# Patient Record
Sex: Female | Born: 1990 | Race: Black or African American | Hispanic: No | Marital: Single | Smoking: Current some day smoker
Health system: Southern US, Community
[De-identification: ages and names within clinical notes are randomized; demographics above are authoritative.]

## PROBLEM LIST (undated history)

## (undated) DIAGNOSIS — L0291 Cutaneous abscess, unspecified: Secondary | ICD-10-CM

---

## 2014-02-23 ENCOUNTER — Encounter (HOSPITAL_COMMUNITY): Payer: Self-pay | Admitting: Emergency Medicine

## 2014-02-23 ENCOUNTER — Emergency Department (HOSPITAL_COMMUNITY)
Admission: EM | Admit: 2014-02-23 | Discharge: 2014-02-23 | Disposition: A | Discharge status: Home / Self Care | Payer: BC Managed Care – PPO | Source: Home / Self Care | Attending: Family Medicine | Admitting: Family Medicine

## 2014-02-23 DIAGNOSIS — R109 Unspecified abdominal pain: Secondary | ICD-10-CM

## 2014-02-23 LAB — POCT URINALYSIS DIP (DEVICE)
BILIRUBIN URINE: NEGATIVE
Glucose, UA: NEGATIVE mg/dL
Ketones, ur: NEGATIVE mg/dL
Leukocytes, UA: NEGATIVE
NITRITE: NEGATIVE
Protein, ur: NEGATIVE mg/dL
Specific Gravity, Urine: >=1.03 (ref 1.005–1.030)
UROBILINOGEN UA: 0.2 mg/dL (ref 0.0–1.0)
pH: 6.5 (ref 5.0–8.0)

## 2014-02-23 LAB — POCT PREGNANCY, URINE: Preg Test, Ur: NEGATIVE

## 2014-02-23 NOTE — ED Notes (Signed)
Lower abdominal area pain , and feels as if there is something moving in her abdominal area. NAD. Reportedly has had symptoms since yesterday

## 2014-02-23 NOTE — Discharge Instructions (Signed)
Thank you for coming in today. Bring the stool sample back.  We will call you with results.  Abdominal Pain, Women Abdominal (stomach, pelvic, or belly) pain can be caused by many things. It is important to tell your doctor:  The location of the pain.  Does it come and go or is it present all the time?  Are there things that start the pain (eating certain foods, exercise)?  Are there other symptoms associated with the pain (fever, nausea, vomiting, diarrhea)? All of this is helpful to know when trying to find the cause of the pain. CAUSES   Stomach: virus or bacteria infection, or ulcer.  Intestine: appendicitis (inflamed appendix), regional ileitis (Crohn's disease), ulcerative colitis (inflamed colon), irritable bowel syndrome, diverticulitis (inflamed diverticulum of the colon), or cancer of the stomach or intestine.  Gallbladder disease or stones in the gallbladder.  Kidney disease, kidney stones, or infection.  Pancreas infection or cancer.  Fibromyalgia (pain disorder).  Diseases of the female organs:  Uterus: fibroid (non-cancerous) tumors or infection.  Fallopian tubes: infection or tubal pregnancy.  Ovary: cysts or tumors.  Pelvic adhesions (scar tissue).  Endometriosis (uterus lining tissue growing in the pelvis and on the pelvic organs).  Pelvic congestion syndrome (female organs filling up with blood just before the menstrual period).  Pain with the menstrual period.  Pain with ovulation (producing an egg).  Pain with an IUD (intrauterine device, birth control) in the uterus.  Cancer of the female organs.  Functional pain (pain not caused by a disease, may improve without treatment).  Psychological pain.  Depression. DIAGNOSIS  Your doctor will decide the seriousness of your pain by doing an examination.  Blood tests.  X-rays.  Ultrasound.  CT scan (computed tomography, special type of X-ray).  MRI (magnetic resonance  imaging).  Cultures, for infection.  Barium enema (dye inserted in the large intestine, to better view it with X-rays).  Colonoscopy (looking in intestine with a lighted tube).  Laparoscopy (minor surgery, looking in abdomen with a lighted tube).  Major abdominal exploratory surgery (looking in abdomen with a large incision). TREATMENT  The treatment will depend on the cause of the pain.   Many cases can be observed and treated at home.  Over-the-counter medicines recommended by your caregiver.  Prescription medicine.  Antibiotics, for infection.  Birth control pills, for painful periods or for ovulation pain.  Hormone treatment, for endometriosis.  Nerve blocking injections.  Physical therapy.  Antidepressants.  Counseling with a psychologist or psychiatrist.  Minor or major surgery. HOME CARE INSTRUCTIONS   Do not take laxatives, unless directed by your caregiver.  Take over-the-counter pain medicine only if ordered by your caregiver. Do not take aspirin because it can cause an upset stomach or bleeding.  Try a clear liquid diet (broth or water) as ordered by your caregiver. Slowly move to a bland diet, as tolerated, if the pain is related to the stomach or intestine.  Have a thermometer and take your temperature several times a day, and record it.  Bed rest and sleep, if it helps the pain.  Avoid sexual intercourse, if it causes pain.  Avoid stressful situations.  Keep your follow-up appointments and tests, as your caregiver orders.  If the pain does not go away with medicine or surgery, you may try:  Acupuncture.  Relaxation exercises (yoga, meditation).  Group therapy.  Counseling. SEEK MEDICAL CARE IF:   You notice certain foods cause stomach pain.  Your home care treatment is not helping  your pain.  You need stronger pain medicine.  You want your IUD removed.  You feel faint or lightheaded.  You develop nausea and vomiting.  You  develop a rash.  You are having side effects or an allergy to your medicine. SEEK IMMEDIATE MEDICAL CARE IF:   Your pain does not go away or gets worse.  You have a fever.  Your pain is felt only in portions of the abdomen. The right side could possibly be appendicitis. The left lower portion of the abdomen could be colitis or diverticulitis.  You are passing blood in your stools (bright red or black tarry stools, with or without vomiting).  You have blood in your urine.  You develop chills, with or without a fever.  You pass out. MAKE SURE YOU:   Understand these instructions.  Will watch your condition.  Will get help right away if you are not doing well or get worse. Document Released: 09/17/2007 Document Revised: 02/12/2012 Document Reviewed: 10/07/2009 [facility] Patient Information 2014 [place], [place].

## 2014-02-23 NOTE — ED Provider Notes (Signed)
Lori Humphrey is a 23 y.o. female who presents to Urgent Care today for abdominal motion. Patient feels a moving sensation in her left abdomen for one day. She suspects that there is something moving inside of her. She is worried that she may have a parasite. She noted some white things in her stool yesterday. She denies any fevers chills nausea vomiting or diarrhea. Her last period was over a month ago. She does not think she could be pregnant as she took a pregnancy test and it was negative. She denies any foreign travel. She feels well otherwise.   History reviewed. No pertinent past medical history. History  Substance Use Topics  . Smoking status: Never Smoker   . Smokeless tobacco: Not on file  . Alcohol Use: Yes   ROS as above Medications: No current facility-administered medications for this encounter.   No current outpatient prescriptions on file.    Exam:  BP 127/85  Pulse 90  Temp(Src) 98.2 F (36.8 C) (Oral)  Resp 16  SpO2 99%  LMP 12/26/2013 Gen: Well NAD HEENT: EOMI,  MMM Lungs: Normal work of breathing. CTABL Heart: RRR no MRG Abd: NABS, Soft. NT, ND no masses palpated. Exts: Brisk capillary refill, warm and well perfused.   Results for orders placed during the hospital encounter of 02/23/14 (from the past 24 hour(s))  POCT URINALYSIS DIP (DEVICE)     Status: Abnormal   Collection Time    02/23/14  1:23 PM      Result Value Ref Range   Glucose, UA NEGATIVE  NEGATIVE mg/dL   Bilirubin Urine NEGATIVE  NEGATIVE   Ketones, ur NEGATIVE  NEGATIVE mg/dL   Specific Gravity, Urine >=1.030  1.005 - 1.030   Hgb urine dipstick TRACE (*) NEGATIVE   pH 6.5  5.0 - 8.0   Protein, ur NEGATIVE  NEGATIVE mg/dL   Urobilinogen, UA 0.2  0.0 - 1.0 mg/dL   Nitrite NEGATIVE  NEGATIVE   Leukocytes, UA NEGATIVE  NEGATIVE  POCT PREGNANCY, URINE     Status: None   Collection Time    02/23/14  1:28 PM      Result Value Ref Range   Preg Test, Ur NEGATIVE  NEGATIVE   No results  found.  Assessment and Plan: 23 y.o. female with abdominal discomfort. This is likely gas moving that she is feeling. We'll obtain a stool ova and parasite. Call patient with results.  Discussed warning signs or symptoms. Please see discharge instructions. Patient expresses understanding.    Rodolph BongEvan S Anicia Leuthold, MD 02/23/14 [phone]

## 2015-09-03 ENCOUNTER — Encounter (HOSPITAL_COMMUNITY): Payer: Self-pay | Admitting: Emergency Medicine

## 2015-09-03 ENCOUNTER — Emergency Department (HOSPITAL_COMMUNITY)
Admission: EM | Admit: 2015-09-03 | Discharge: 2015-09-03 | Disposition: A | Discharge status: Home / Self Care | Payer: BLUE CROSS/BLUE SHIELD | Attending: Emergency Medicine | Admitting: Emergency Medicine

## 2015-09-03 DIAGNOSIS — L0501 Pilonidal cyst with abscess: Secondary | ICD-10-CM | POA: Diagnosis present

## 2015-09-03 HISTORY — DX: Cutaneous abscess, unspecified: L02.91

## 2015-09-03 MED ORDER — LIDOCAINE-EPINEPHRINE (PF) 2 %-1:200000 IJ SOLN
20.0000 mL | Freq: Once | INTRAMUSCULAR | Status: AC
  Administered 2015-09-03: 20 mL
  Filled 2015-09-03: qty 20

## 2015-09-03 NOTE — Discharge Instructions (Signed)
Read the information below.  You may return to the Emergency Department at any time for worsening condition or any new symptoms that concern you.  If you develop redness, swelling, uncontrolled pain, or fevers greater than 100.4, return to the ER immediately for a recheck.    Pilonidal Cyst, Care After A pilonidal cyst occurs when hairs get trapped (ingrown) beneath the skin in the crease between the buttocks over your sacrum (the bone under that crease). Pilonidal cysts are most common in young men with a lot of body hair. When the cyst breaks(ruptured) or leaks, fluid from the cyst may cause burning and itching. If the cyst becomes infected, it causes a painful swelling filled with pus (abscess). The pus and trapped hairs need to be removed (often by lancing) so that the infection can heal. The word pilonidal means hair nest. HOME CARE INSTRUCTIONS If the pilonidal sinus was NOT DRAINING OR LANCED:  Keep the area clean and dry. Bathe or shower daily. Wash the area well with a germ-killing soap. Hot tub baths may help prevent infection. Dry the area well with a towel.  Avoid tight clothing in order to keep area as moisture-free as possible.  Keep area between buttocks as free from hair as possible. A depilatory may be used.  Take antibiotics as directed.  Only take over-the-counter or prescription medicines for pain, discomfort, or fever as directed by your caregiver. If the cyst WAS INFECTED AND NEEDED TO BE DRAINED:  Your caregiver may have packed the wound with gauze to keep the wound open. This allows the wound to heal from the inside outward and continue to drain.  Return as directed for a wound check.  If you take tub baths or showers, repack the wound with gauze as directed following. Sponge baths are a good alternative. Sitz baths may be used three to four times a day or as directed.  If an antibiotic was ordered to fight the infection, take as directed.  Only take  over-the-counter or prescription medicines for pain, discomfort, or fever as directed by your caregiver.  If a drain was in place and removed, use sitz baths for 20 minutes 4 times per day. Clean the wound gently with mild unscented soap, pat dry, and then apply a dry dressing as directed. If you had surgery and IT WAS MARSUPIALIZED (LEFT OPEN):  Your wound was packed with gauze to keep the wound open. This allows the wound to heal from the inside outwards and continue draining. The changing of the dressing regularly also helps keep the wound clean.  Return as directed for a wound check.  If you take tub baths or showers, repack the wound with gauze as directed following. Sponge baths are a good alternative. Sitz baths can also be used. This may be done three to four times a day or as directed.  If an antibiotic was ordered to fight the infection, take as directed.  Only take over-the-counter or prescription medicines for pain, discomfort, or fever as directed by your caregiver.  If you had surgery and the wound was closed you may care for it as directed. This generally includes keeping it dry and clean and dressing it as directed. SEEK MEDICAL CARE IF:   You have increased pain, swelling, redness, drainage, or bleeding from the area.  You have a fever.  You have muscles aches, dizziness, or a general ill feeling. Document Released: 12/21/2006 Document Revised: 07/23/2013 Document Reviewed: 03/07/2007 [facility] Patient Information 2015 [place], [place]. This information  is not intended to replace advice given to you by your health care provider. Make sure you discuss any questions you have with your health care provider.  Pilonidal Cyst A pilonidal cyst occurs when hairs get trapped (ingrown) beneath the skin in the crease between the buttocks over your sacrum (the bone under that crease). Pilonidal cysts are most common in young men with a lot of body hair. When the cyst is ruptured  (breaks) or leaking, fluid from the cyst may cause burning and itching. If the cyst becomes infected, it causes a painful swelling filled with pus (abscess). The pus and trapped hairs need to be removed (often by lancing) so that the infection can heal. However, recurrence is common and an operation may be needed to remove the cyst. HOME CARE INSTRUCTIONS   If the cyst was NOT INFECTED:  Keep the area clean and dry. Bathe or shower daily. Wash the area well with a germ-killing soap. Warm tub baths may help prevent infection and help with drainage. Dry the area well with a towel.  Avoid tight clothing to keep area as moisture free as possible.  Keep area between buttocks as free of hair as possible. A depilatory may be used.  If the cyst WAS INFECTED and needed to be drained:  Your caregiver packed the wound with gauze to keep the wound open. This allows the wound to heal from the inside outwards and continue draining.  Return for a wound check in 1 day or as suggested.  If you take tub baths or showers, repack the wound with gauze following them. Sponge baths (at the sink) are a good alternative.  If an antibiotic was ordered to fight the infection, take as directed.  Only take over-the-counter or prescription medicines for pain, discomfort, or fever as directed by your caregiver.  After the drain is removed, use sitz baths for 20 minutes 4 times per day. Clean the wound gently with mild unscented soap, pat dry, and then apply a dry dressing. SEEK MEDICAL CARE IF:   You have increased pain, swelling, redness, drainage, or bleeding from the area.  You have a fever.  You have muscles aches, dizziness, or a general ill feeling. Document Released: 11/17/2000 Document Revised: 02/12/2012 Document Reviewed: 01/15/2009 [facility] Patient Information 2015 [place], [place]. This information is not intended to replace advice given to you by your health care provider. Make sure you discuss any  questions you have with your health care provider.

## 2015-09-03 NOTE — ED Notes (Signed)
Pt c/o abscess between buttock area x 1 week. Pt has history of same in past x 2.

## 2015-09-03 NOTE — ED Provider Notes (Signed)
CSN: [id number]     Arrival date & time 09/03/15  4098 History  By signing my name below, I, Ronney Lion, attest that this documentation has been prepared under the direction and in the presence of [facility], PA-C. Electronically Signed: Ronney Lion, ED Scribe. 09/03/2015. 11:27 AM.     Chief Complaint  Patient presents with  . Abscess   The history is provided by the patient. No language interpreter was used.   HPI Comments: Lori Humphrey is a 24 y.o. female who presents to the Emergency Department complaining of an abscess with associated pain to her buttock area that she first noticed 1 week ago. Patient states she had an abscess in the same area 1 year ago and had it I&D'ed. She was told there appeared to be a pocket forming underneath and was recommended she follow up on it, which she has not yet done. Patient was also prescribed Bactrim, Keflex, and Vicodin, which she has leftover. She reports she has she taken 2 doses of Keflex, but she has only taken 1 pill/day, even though she is prescribed Keflex 4x/day. She reports she has not been taking any of the Bactrim because the pills are hard to swallow. She denies fever, chills, abdominal pain, or bowel or bladder symptoms.    Past Medical History  Diagnosis Date  . Abscess    History reviewed. No pertinent past surgical history. No family history on file. Social History  Substance Use Topics  . Smoking status: Never Smoker   . Smokeless tobacco: None  . Alcohol Use: Yes   OB History    No data available     Review of Systems  Constitutional: Negative for fever and chills.  Gastrointestinal: Negative for nausea, vomiting, abdominal pain and diarrhea.       Negative for bowel or bladder symptoms  Genitourinary: Negative for dysuria, urgency, frequency, vaginal bleeding and vaginal discharge.  Musculoskeletal: Negative for myalgias.  Skin: Negative for color change.       Positive for abscess on gluteal cleft   Allergic/Immunologic: Negative for immunocompromised state.  Hematological: Does not bruise/bleed easily.  Psychiatric/Behavioral: Negative for self-injury.   Allergies  Peanut-containing drug products  Home Medications   Prior to Admission medications   Not on File   BP 123/85 mmHg  Pulse 89  Temp(Src) 98.1 F (36.7 C) (Oral)  Resp 20  Ht  (1.6 m)  Wt 125 lb (56.7 kg)  BMI 22.15 kg/m2  SpO2 100%  LMP 08/03/2015 Physical Exam  Constitutional: She appears well-developed and well-nourished. No distress.  HENT:  Head: Normocephalic and atraumatic.  Neck: Neck supple.  Pulmonary/Chest: Effort normal.  Neurological: She is alert.  Skin: She is not diaphoretic.     Tender fluctuant swelling over the right gluteal cleft.  No overlying erythema.    Nursing note and vitals reviewed.   ED Course  Procedures (including critical care time)  DIAGNOSTIC STUDIES: Oxygen Saturation is 100% on RA, normal by my interpretation.    COORDINATION OF CARE: 10:41 AM - Discussed treatment plan with pt at bedside which includes I&D. Will also give referral to general surgery due to location of abscess (pilonoidal). Pt verbalized understanding and agreed to plan.   INCISION AND DRAINAGE PROCEDURE NOTE: Patient identification was confirmed and verbal consent was obtained. This procedure was performed by Trixie Dredge, PA-C, at 11:01 AM. Site: Right gluteal cleft Sterile procedures observed Needle size: 25 Anesthetic used (type and amt): Lidocaine 2% w/ epi, 8.5 mL  Blade size: 11 Drainage: Copious purulent Complexity: Complex Packing used and pt instructed to remove in 3 days Site anesthetized, incision made over site, wound drained and explored loculations, rinsed with copious amounts of normal saline, wound packed with sterile gauze, covered with dry, sterile dressing.  Pt tolerated procedure well without complications.  Instructions for care discussed verbally and pt provided  with additional written instructions for homecare and f/u.   MDM   Final diagnoses:  Pilonidal abscess   Afebrile, nontoxic patient with pilonidal abscess without overlying cellulitis.  I&D in department.   D/C home with general surgery follow up.  Discussed home medications and how to use them.   Discussed result, findings, treatment, and follow up  with patient.  Pt given return precautions.  Pt verbalizes understanding and agrees with plan.        I personally performed the services described in this documentation, which was scribed in my presence. The recorded information has been reviewed and is accurate.     Trixie Dredge, PA-C 09/03/15 1227  Vanetta Mulders, MD 09/05/15 [phone]

## 2017-05-03 ENCOUNTER — Ambulatory Visit (HOSPITAL_COMMUNITY)
Admission: EM | Admit: 2017-05-03 | Discharge: 2017-05-03 | Disposition: A | Discharge status: Home / Self Care | Payer: Self-pay | Attending: Internal Medicine | Admitting: Internal Medicine

## 2017-05-03 ENCOUNTER — Ambulatory Visit (INDEPENDENT_AMBULATORY_CARE_PROVIDER_SITE_OTHER): Payer: Self-pay

## 2017-05-03 ENCOUNTER — Encounter (HOSPITAL_COMMUNITY): Payer: Self-pay | Admitting: *Deleted

## 2017-05-03 DIAGNOSIS — R062 Wheezing: Secondary | ICD-10-CM

## 2017-05-03 DIAGNOSIS — Z3202 Encounter for pregnancy test, result negative: Secondary | ICD-10-CM

## 2017-05-03 DIAGNOSIS — J181 Lobar pneumonia, unspecified organism: Secondary | ICD-10-CM

## 2017-05-03 DIAGNOSIS — R05 Cough: Secondary | ICD-10-CM

## 2017-05-03 DIAGNOSIS — J189 Pneumonia, unspecified organism: Secondary | ICD-10-CM

## 2017-05-03 LAB — POCT PREGNANCY, URINE: Preg Test, Ur: NEGATIVE

## 2017-05-03 MED ORDER — LEVOFLOXACIN 500 MG PO TABS: 500.0000 mg | ORAL_TABLET | Freq: Every day | ORAL | 0 refills | Status: AC

## 2017-05-03 MED ORDER — ALBUTEROL SULFATE (2.5 MG/3ML) 0.083% IN NEBU
INHALATION_SOLUTION | RESPIRATORY_TRACT | Status: AC
  Filled 2017-05-03: qty 3

## 2017-05-03 MED ORDER — ALBUTEROL SULFATE HFA 108 (90 BASE) MCG/ACT IN AERS: 1.0000 | INHALATION_SPRAY | Freq: Four times a day (QID) | RESPIRATORY_TRACT | 0 refills | Status: AC | PRN

## 2017-05-03 MED ORDER — GUAIFENESIN-CODEINE 100-10 MG/5ML PO SOLN
5.0000 mL | Freq: Three times a day (TID) | ORAL | 0 refills | Status: AC | PRN
Start: 2017-05-03 — End: ?

## 2017-05-03 MED ORDER — ALBUTEROL SULFATE (2.5 MG/3ML) 0.083% IN NEBU
2.5000 mg | INHALATION_SOLUTION | Freq: Once | RESPIRATORY_TRACT | Status: AC
  Administered 2017-05-03: 2.5 mg via RESPIRATORY_TRACT

## 2017-05-03 NOTE — ED Triage Notes (Signed)
Pt  Began      Having   Sinus  Drainage     And  Headache      X  8   Days    Cough   Started  sev  Days  later   sev  Days  Later

## 2017-05-03 NOTE — Medical Student Note (Signed)
[facility] Provider Student Note For educational purposes for Medical, PA and NP students only and not part of the legal medical record.   CSN: [id number] Arrival date & time: 05/03/17  1659     History   Chief Complaint Chief Complaint  Patient presents with  . Cough    HPI Lori Humphrey is a 26 y.o. female.  Pt is a 26 year old female that presents with 1 week of cough, and congestion in her chest. Denies any nasal congestion, runny nose, sore throat, ear pain. sts slight fever and chills. sts she has been using Thera flu with some relief.    The history is provided by the patient.  Cough  Cough characteristics:  Productive and harsh Sputum characteristics:  Yellow and green Severity:  Moderate Onset quality:  Gradual Timing:  Constant Progression:  Worsening Chronicity:  New Smoker: no   Context: occupational exposure   Relieved by: thera flu. Worsened by:  Nothing Ineffective treatments: thera flu gives some relief. Associated symptoms: chest pain, chills, fever, headaches, myalgias and wheezing   Associated symptoms: no ear pain, no eye discharge, no rash, no rhinorrhea, no shortness of breath, no sinus congestion, no sore throat and no weight loss     Past Medical History:  Diagnosis Date  . Abscess     There are no active problems to display for this patient.   History reviewed. No pertinent surgical history.  OB History    No data available       Home Medications    Prior to Admission medications   Medication Sig Start Date End Date Taking? Authorizing Provider  Levonorgest-Eth Estrad 91-Day (AMETHIA PO) Take by mouth.   Yes [provider]    Family History History reviewed. No pertinent family history.  Social History Social History  Substance Use Topics  . Smoking status: Current Some Day Smoker  . Smokeless tobacco: Never Used  . Alcohol use Yes     Allergies   Peanut-containing drug products   Review of  Systems Review of Systems  Constitutional: Positive for appetite change, chills and fever. Negative for weight loss.  HENT: Negative for ear pain, postnasal drip, rhinorrhea and sore throat.   Eyes: Negative for discharge.  Respiratory: Positive for cough, chest tightness and wheezing. Negative for shortness of breath.   Cardiovascular: Positive for chest pain.  Gastrointestinal: Negative for diarrhea, nausea and vomiting.  Endocrine: Negative.   Genitourinary: Negative.   Musculoskeletal: Positive for myalgias.  Skin: Negative for rash.  Allergic/Immunologic: Negative.   Neurological: Positive for headaches.  Hematological: Negative.   Psychiatric/Behavioral: Negative.      Physical Exam Updated Vital Signs BP 123/72 (BP Location: Left Arm)   Pulse (!) 120 Comment: rn notified  Temp 99.8 F (37.7 C) (Oral)   Resp 16   LMP 04/25/2017   SpO2 99%   Physical Exam  Constitutional: She is oriented to person, place, and time. She appears well-developed and well-nourished. No distress.  HENT:  Head: Normocephalic and atraumatic.  Right Ear: External ear normal.  Left Ear: External ear normal.  Nose: Nose normal.  Mouth/Throat: Oropharynx is clear and moist. No oropharyngeal exudate.  Eyes: Conjunctivae and EOM are normal. Pupils are equal, round, and reactive to light. Right eye exhibits no discharge. Left eye exhibits no discharge.  Neck: Normal range of motion. Neck supple.  Cardiovascular: Regular rhythm and normal heart sounds.   Pt tachycardic with regular rhythm   Pulmonary/Chest: Effort normal.  She has wheezes. She exhibits tenderness.  Pt with diminished breath sounds throughout. Inspiratory and expiratory wheezing present in left upper and lower lobes.   Abdominal: Soft. Bowel sounds are normal.  Musculoskeletal: Normal range of motion.  Lymphadenopathy:    She has no cervical adenopathy.  Neurological: She is alert and oriented to person, place, and time.  Skin:  Skin is warm and dry. Capillary refill takes less than 2 seconds. She is not diaphoretic.  Psychiatric: She has a normal mood and affect.     ED Treatments / Results  Labs (all labs ordered are listed, but only abnormal results are displayed) Labs Reviewed - No data to display  EKG  EKG Interpretation None       Radiology No results found.  Procedures Procedures (including critical care time)  Medications Ordered in ED Medications  albuterol (PROVENTIL) (2.5 MG/3ML) 0.083% nebulizer solution 2.5 mg (not administered)     Initial Impression / Assessment and Plan / ED Course  I have reviewed the triage vital signs and the nursing notes.  Pertinent labs & imaging results that were available during my care of the patient were reviewed by me and considered in my medical decision making (see chart for details).       Final Clinical Impressions(s) / ED Diagnoses   Final diagnoses:  None    New Prescriptions New Prescriptions   No medications on file

## 2017-05-03 NOTE — ED Provider Notes (Signed)
CSN: [id number]     Arrival date & time 05/03/17  1659 History   First MD Initiated Contact with Patient 05/03/17 1740     Chief Complaint  Patient presents with  . Cough   (Consider location/radiation/quality/duration/timing/severity/associated sxs/prior Treatment) 26 year old female that presents with 1 week history of cough, congestion in her chest, denies nasal congestion, rhinorrhea, sore throat, or ear pain, states she has had slight fever and chills, has taken theraflu with some relief.   The history is provided by the patient.  Cough  Cough characteristics:  Productive and harsh Sputum characteristics:  Yellow and green Severity:  Moderate Onset quality:  Gradual Duration:  1 week Timing:  Constant Progression:  Worsening Chronicity:  New Smoker: no   Context: occupational exposure   Relieved by: theraflu. Worsened by:  Nothing Associated symptoms: chest pain, chills, fever, headaches, myalgias and wheezing   Associated symptoms: no ear pain, no rhinorrhea and no sore throat     Past Medical History:  Diagnosis Date  . Abscess    History reviewed. No pertinent surgical history. History reviewed. No pertinent family history. Social History  Substance Use Topics  . Smoking status: Current Some Day Smoker  . Smokeless tobacco: Never Used  . Alcohol use Yes   OB History    No data available     Review of Systems  Constitutional: Positive for appetite change, chills and fever. Negative for unexpected weight change.  HENT: Negative for congestion, ear pain, postnasal drip, rhinorrhea and sore throat.   Respiratory: Positive for cough, chest tightness and wheezing.   Cardiovascular: Positive for chest pain. Negative for palpitations.  Gastrointestinal: Negative for abdominal pain, diarrhea, nausea and vomiting.  Musculoskeletal: Positive for myalgias. Negative for neck pain and neck stiffness.  Skin: Negative.   Neurological: Positive for headaches.   Hematological: Negative.     Allergies  Peanut-containing drug products  Home Medications   Prior to Admission medications   Medication Sig Start Date End Date Taking? Authorizing Provider  Levonorgest-Eth Estrad 91-Day (AMETHIA PO) Take by mouth.   Yes [provider]  albuterol (PROVENTIL HFA;VENTOLIN HFA) 108 (90 Base) MCG/ACT inhaler Inhale 1-2 puffs into the lungs every 6 (six) hours as needed for wheezing or shortness of breath. 05/03/17   Dorena Bodo, NP  guaiFENesin-codeine 100-10 MG/5ML syrup Take 5 mLs by mouth 3 (three) times daily as needed for cough. 05/03/17   Dorena Bodo, NP  levofloxacin (LEVAQUIN) 500 MG tablet Take 1 tablet (500 mg total) by mouth daily. 05/03/17   Dorena Bodo, NP   Meds Ordered and Administered this Visit   Medications  albuterol (PROVENTIL) (2.5 MG/3ML) 0.083% nebulizer solution 2.5 mg (2.5 mg Nebulization Given 05/03/17 1800)    BP 123/72 (BP Location: Left Arm)   Pulse (!) 120 Comment: rn notified  Temp 99.8 F (37.7 C) (Oral)   Resp 16   LMP 04/10/2017 (Exact Date)   SpO2 99%  No data found.   Physical Exam  Constitutional: She is oriented to person, place, and time. She appears well-developed and well-nourished. No distress.  HENT:  Head: Normocephalic.  Right Ear: External ear normal.  Left Ear: External ear normal.  Mouth/Throat: Oropharynx is clear and moist.  Eyes: Conjunctivae are normal.  Neck: Normal range of motion. Neck supple.  Cardiovascular: Regular rhythm.  Tachycardia present.   Pulmonary/Chest: Effort normal. She has wheezes. She exhibits tenderness.  Abdominal: Soft. Bowel sounds are normal.  Neurological: She is alert and oriented to  person, place, and time.  Skin: Skin is warm and dry. She is not diaphoretic.  Psychiatric: She has a normal mood and affect. Her behavior is normal.  Nursing note and vitals reviewed.   Urgent Care Course     Procedures (including critical care  time)  Labs Review Labs Reviewed  POCT PREGNANCY, URINE    Imaging Review Dg Chest 2 View  Result Date: 05/03/2017 CLINICAL DATA:  26 year old female with productive cough for 1 week. EXAM: CHEST  2 VIEW COMPARISON:  None. FINDINGS: Patchy left lung base opacity on the frontal view is not well correlated on the lateral. No pleural effusion. Left upper lung and right lung appear normally aerated. Mediastinal contours are within normal limits. Visualized tracheal air column is within normal limits. No acute osseous abnormality identified. Negative visible bowel gas pattern. Incidental nipple piercings. IMPRESSION: Pneumonia in the lingula versus left lower lobe. No pleural effusion. Electronically Signed   By: Odessa FlemingH  Hall M.D.   On: 05/03/2017 18:06      MDM   1. Community acquired pneumonia of left lower lobe of lung (HCC)    Community-acquired pneumonia. Follow-up with lung sounds after the albuterol treatment much improved, however continues to have diminished lung sounds lower left. Started on Levaquin, albuterol, given Cheratussin for cough, provided work note, return to clinic in one to 2 weeks for repeat imaging.    Dorena BodoKennard, Rashia Mckesson, NP 05/03/17 [phone]

## 2017-05-03 NOTE — Discharge Instructions (Signed)
You have acute immunity acquired pneumonia, we have started you on Levaquin. Take one tablet daily for 5 days. Do not run, go hiking, or do other strenuous physical activity while on this medicine. Also, do not get pregnant on this medicine.  I have also prescribed a medicine for cough called Cheratussin, this medicine is a narcotic, it will cause drowsiness, and it is addictive. Do not take more than what is necessary, do not drink alcohol while taking, and do not operate any heavy machinery while taking this medicine.   For wheezing, and shortness of breath, prescribed an albuterol inhaler. Do 1-2 puffs every 4-6 hours as needed for wheezing and shortness of breath.  Return to clinic in one to two weeks for repeat x-rays to ensure clearance.

## 2017-09-16 ENCOUNTER — Encounter (HOSPITAL_COMMUNITY): Payer: Self-pay | Admitting: Emergency Medicine

## 2017-09-16 ENCOUNTER — Emergency Department (HOSPITAL_COMMUNITY): Payer: No Typology Code available for payment source

## 2017-09-16 ENCOUNTER — Emergency Department (HOSPITAL_COMMUNITY)
Admission: EM | Admit: 2017-09-16 | Discharge: 2017-09-16 | Disposition: A | Discharge status: Home / Self Care | Payer: No Typology Code available for payment source | Attending: Emergency Medicine | Admitting: Emergency Medicine

## 2017-09-16 DIAGNOSIS — Y9389 Activity, other specified: Secondary | ICD-10-CM | POA: Diagnosis not present

## 2017-09-16 DIAGNOSIS — S161XXA Strain of muscle, fascia and tendon at neck level, initial encounter: Secondary | ICD-10-CM

## 2017-09-16 DIAGNOSIS — Y999 Unspecified external cause status: Secondary | ICD-10-CM | POA: Diagnosis not present

## 2017-09-16 DIAGNOSIS — S199XXA Unspecified injury of neck, initial encounter: Secondary | ICD-10-CM | POA: Diagnosis present

## 2017-09-16 DIAGNOSIS — Y92410 Unspecified street and highway as the place of occurrence of the external cause: Secondary | ICD-10-CM | POA: Diagnosis not present

## 2017-09-16 DIAGNOSIS — S0003XA Contusion of scalp, initial encounter: Secondary | ICD-10-CM | POA: Insufficient documentation

## 2017-09-16 DIAGNOSIS — Z79899 Other long term (current) drug therapy: Secondary | ICD-10-CM | POA: Diagnosis not present

## 2017-09-16 DIAGNOSIS — M549 Dorsalgia, unspecified: Secondary | ICD-10-CM | POA: Insufficient documentation

## 2017-09-16 DIAGNOSIS — F17200 Nicotine dependence, unspecified, uncomplicated: Secondary | ICD-10-CM | POA: Diagnosis not present

## 2017-09-16 MED ORDER — CYCLOBENZAPRINE HCL 10 MG PO TABS
10.0000 mg | ORAL_TABLET | Freq: Once | ORAL | Status: AC
  Administered 2017-09-16: 10 mg via ORAL

## 2017-09-16 MED ORDER — CYCLOBENZAPRINE HCL 10 MG PO TABS: 10.0000 mg | ORAL_TABLET | Freq: Two times a day (BID) | ORAL | 0 refills | Status: AC | PRN

## 2017-09-16 MED ORDER — IBUPROFEN 200 MG PO TABS
400.0000 mg | ORAL_TABLET | Freq: Once | ORAL | Status: AC
  Administered 2017-09-16: 400 mg via ORAL
  Filled 2017-09-16: qty 2

## 2017-09-16 MED ORDER — DICLOFENAC SODIUM 50 MG PO TBEC: 50.0000 mg | DELAYED_RELEASE_TABLET | Freq: Two times a day (BID) | ORAL | 0 refills | Status: AC
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
## 2017-09-16 NOTE — Discharge Instructions (Signed)
Take the medication as directed. Do not drive while taking the muscle relaxer as it will make you sleepy.

## 2018-03-26 ENCOUNTER — Encounter (HOSPITAL_COMMUNITY): Payer: Self-pay | Admitting: Emergency Medicine

## 2018-03-26 ENCOUNTER — Ambulatory Visit (INDEPENDENT_AMBULATORY_CARE_PROVIDER_SITE_OTHER): Payer: BC Managed Care – PPO

## 2018-03-26 ENCOUNTER — Ambulatory Visit (HOSPITAL_COMMUNITY)
Admission: EM | Admit: 2018-03-26 | Discharge: 2018-03-26 | Disposition: A | Discharge status: Home / Self Care | Payer: BC Managed Care – PPO | Attending: Family Medicine | Admitting: Family Medicine

## 2018-03-26 DIAGNOSIS — J321 Chronic frontal sinusitis: Secondary | ICD-10-CM

## 2018-03-26 DIAGNOSIS — J0110 Acute frontal sinusitis, unspecified: Secondary | ICD-10-CM

## 2018-03-26 MED ORDER — AMOXICILLIN-POT CLAVULANATE 875-125 MG PO TABS: 1.0000 | ORAL_TABLET | Freq: Two times a day (BID) | ORAL | 0 refills | Status: AC

## 2018-03-26 NOTE — ED Provider Notes (Signed)
[facility]    CSN: [id number] Arrival date & time: 03/26/18  1000     History   Chief Complaint Chief Complaint  Patient presents with  . Cough    HPI Paulene FloorKayla Mossa is a 27 y.o. female.   With insignificant medical history, presents today for 1.5 week duration of productive coughing, congestion, sneezing, runny nose and is starting to feel out of breath. Patient denies fever.  Endorses chest pain with coughing.  Patient is concerned for pneumonia as this feel like her previous pneumonia. Denies any alleviating or aggravating factors.  Have tried several over-the-counter medications without any relief.     Past Medical History:  Diagnosis Date  . Abscess     There are no active problems to display for this patient.   History reviewed. No pertinent surgical history.  OB History   None      Home Medications    Prior to Admission medications   Medication Sig Start Date End Date Taking? Authorizing Provider  albuterol (PROVENTIL HFA;VENTOLIN HFA) 108 (90 Base) MCG/ACT inhaler Inhale 1-2 puffs into the lungs every 6 (six) hours as needed for wheezing or shortness of breath. 05/03/17   Dorena BodoKennard, Lawrence, NP  amoxicillin-clavulanate (AUGMENTIN) 875-125 MG tablet Take 1 tablet by mouth 2 (two) times daily for 7 days. 03/26/18 04/02/18  Lucia EstelleZheng, Trasean Delima, NP  cyclobenzaprine (FLEXERIL) 10 MG tablet Take 1 tablet (10 mg total) by mouth 2 (two) times daily as needed for muscle spasms. 09/16/17   Janne NapoleonNeese, Hope M, NP  diclofenac (VOLTAREN) 50 MG EC tablet Take 1 tablet (50 mg total) by mouth 2 (two) times daily. 09/16/17   Janne NapoleonNeese, Hope M, NP  guaiFENesin-codeine 100-10 MG/5ML syrup Take 5 mLs by mouth 3 (three) times daily as needed for cough. 05/03/17   Dorena BodoKennard, Lawrence, NP  levofloxacin (LEVAQUIN) 500 MG tablet Take 1 tablet (500 mg total) by mouth daily. 05/03/17   Dorena BodoKennard, Lawrence, NP  Levonorgest-Eth Charlott HollerEstrad 91-Day (AMETHIA PO) Take by mouth.    [provider]     Family History History reviewed. No pertinent family history.  Social History Social History   Tobacco Use  . Smoking status: Current Some Day Smoker  . Smokeless tobacco: Never Used  Substance Use Topics  . Alcohol use: Yes  . Drug use: No     Allergies   Peanut-containing drug products   Review of Systems Review of Systems  Constitutional: Positive for fatigue. Negative for diaphoresis and fever.  HENT: Positive for congestion, sinus pressure, sinus pain and sneezing. Negative for ear pain, rhinorrhea and sore throat.   Respiratory: Positive for cough and shortness of breath. Negative for wheezing.   Cardiovascular: Positive for chest pain (Chest pain with coughing.). Negative for palpitations.  Gastrointestinal: Positive for diarrhea. Negative for abdominal pain.  Neurological: Positive for headaches. Negative for dizziness.     Physical Exam Triage Vital Signs ED Triage Vitals [03/26/18 1007]  Enc Vitals Group     BP 120/78     Pulse Rate (!) 105     Resp 16     Temp 99.1 F (37.3 C)     Temp Source Oral     SpO2 100 %     Weight      Height      Head Circumference      Peak Flow      Pain Score      Pain Loc      Pain Edu?  Excl. in GC?    No data found.  Updated Vital Signs BP 120/78 (BP Location: Right Arm)   Pulse (!) 105   Temp 99.1 F (37.3 C) (Oral)   Resp 16   SpO2 100%    Physical Exam  Constitutional: She is oriented to person, place, and time. She appears well-developed and well-nourished.  HENT:  Head: Normocephalic and atraumatic.  Right Ear: External ear normal.  Left Ear: External ear normal.  Nose: Nose normal.  Mouth/Throat: Oropharynx is clear and moist. No oropharyngeal exudate.  TM pearly gray without erythema.  Frontal sinuses are tender to percuss.  Eyes: Pupils are equal, round, and reactive to light. Conjunctivae are normal.  Neck: Normal range of motion. Neck supple.  Cardiovascular: Normal rate, regular  rhythm and normal heart sounds.  No murmur heard. Pulmonary/Chest: Effort normal. She has rales.  Abdominal: Soft. Bowel sounds are normal. There is no tenderness.  Neurological: She is alert and oriented to person, place, and time.  Skin: Skin is warm and dry.  Psychiatric: She has a normal mood and affect.  Nursing note and vitals reviewed.    UC Treatments / Results  Labs (all labs ordered are listed, but only abnormal results are displayed) Labs Reviewed - No data to display  EKG None Radiology Dg Chest 2 View  Result Date: 03/26/2018 CLINICAL DATA:  Productive cough and shortness of breath for 1 week. EXAM: CHEST - 2 VIEW COMPARISON:  05/03/2017 FINDINGS: The heart size and mediastinal contours are within normal limits. Both lungs are clear. Previously seen infiltrate in left lung base is no longer visualized. The visualized skeletal structures are unremarkable. IMPRESSION: Negative.  No active cardiopulmonary disease. Electronically Signed   By: Myles Rosenthal M.D.   On: 03/26/2018 10:42    Procedures Procedures (including critical care time)  Medications Ordered in UC Medications - No data to display   Initial Impression / Assessment and Plan / UC Course  I have reviewed the triage vital signs and the nursing notes.  Pertinent labs & imaging results that were available during my care of the patient were reviewed by me and considered in my medical decision making (see chart for details).  Final Clinical Impressions(s) / UC Diagnoses   Final diagnoses:  Acute non-recurrent frontal sinusitis   Chest x-ray is negative for pneumonia.  Given the duration of her symptoms and sinus pain, will treat empirically with Augmentin twice a day times 7 days. Reviewed directions for usage and side effects. Patient states understanding and will call with questions or problems. Patient instructed to call or follow up with his/her primary care doctor if failure to improve or change in  symptoms. Discharge instruction given.   ED Discharge Orders        Ordered    amoxicillin-clavulanate (AUGMENTIN) 875-125 MG tablet  2 times daily     03/26/18 1047     Controlled Substance Prescriptions [place] Controlled Substance Registry consulted? Not Applicable   Lucia Estelle, NP 03/26/18 1048

## 2018-03-26 NOTE — ED Triage Notes (Signed)
Pt sts cough and URI sx; pt sts hx of PNA in past
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
